# Patient Record
Sex: Male | Born: 1975 | Race: White | Hispanic: No | Marital: Married | Smoking: Never smoker
Health system: Southern US, Community
[De-identification: ages and names within clinical notes are randomized; demographics above are authoritative.]

## PROBLEM LIST (undated history)

## (undated) DIAGNOSIS — M419 Scoliosis, unspecified: Secondary | ICD-10-CM

## (undated) DIAGNOSIS — J45909 Unspecified asthma, uncomplicated: Secondary | ICD-10-CM

## (undated) DIAGNOSIS — L309 Dermatitis, unspecified: Secondary | ICD-10-CM

## (undated) HISTORY — DX: Dermatitis, unspecified: L30.9

## (undated) HISTORY — DX: Unspecified asthma, uncomplicated: J45.909

---

## 2017-01-28 ENCOUNTER — Encounter (HOSPITAL_COMMUNITY): Payer: Self-pay

## 2017-01-28 ENCOUNTER — Emergency Department (HOSPITAL_COMMUNITY)
Admission: EM | Admit: 2017-01-28 | Discharge: 2017-01-28 | Disposition: A | Discharge status: Home / Self Care | Payer: Managed Care, Other (non HMO) | Attending: Emergency Medicine | Admitting: Emergency Medicine

## 2017-01-28 ENCOUNTER — Emergency Department (HOSPITAL_COMMUNITY): Payer: Managed Care, Other (non HMO)

## 2017-01-28 DIAGNOSIS — K5900 Constipation, unspecified: Secondary | ICD-10-CM | POA: Insufficient documentation

## 2017-01-28 HISTORY — DX: Scoliosis, unspecified: M41.9

## 2017-01-28 LAB — URINALYSIS, ROUTINE W REFLEX MICROSCOPIC
BILIRUBIN URINE: NEGATIVE
Glucose, UA: NEGATIVE mg/dL
Hgb urine dipstick: NEGATIVE
KETONES UR: NEGATIVE mg/dL
Leukocytes, UA: NEGATIVE
NITRITE: NEGATIVE
PROTEIN: NEGATIVE mg/dL
Specific Gravity, Urine: 1.013 (ref 1.005–1.030)
pH: 8 (ref 5.0–8.0)

## 2017-01-28 LAB — COMPREHENSIVE METABOLIC PANEL
ALBUMIN: 4.6 g/dL (ref 3.5–5.0)
ALK PHOS: 73 U/L (ref 38–126)
ALT: 47 U/L (ref 17–63)
ANION GAP: 9 (ref 5–15)
AST: 45 U/L — ABNORMAL HIGH (ref 15–41)
BILIRUBIN TOTAL: 0.9 mg/dL (ref 0.3–1.2)
BUN: 13 mg/dL (ref 6–20)
CALCIUM: 9.5 mg/dL (ref 8.9–10.3)
CO2: 28 mmol/L (ref 22–32)
Chloride: 99 mmol/L — ABNORMAL LOW (ref 101–111)
Creatinine, Ser: 0.94 mg/dL (ref 0.61–1.24)
GFR calc Af Amer: >60 mL/min (ref >60)
GLUCOSE: 98 mg/dL (ref 65–99)
Potassium: 3.4 mmol/L — ABNORMAL LOW (ref 3.5–5.1)
Sodium: 136 mmol/L (ref 135–145)
TOTAL PROTEIN: 7.2 g/dL (ref 6.5–8.1)

## 2017-01-28 LAB — CBC
HCT: 42.7 % (ref 39.0–52.0)
HEMOGLOBIN: 15.2 g/dL (ref 13.0–17.0)
MCH: 30.2 pg (ref 26.0–34.0)
MCHC: 35.6 g/dL (ref 30.0–36.0)
MCV: 84.9 fL (ref 78.0–100.0)
Platelets: 248 10*3/uL (ref 150–400)
RBC: 5.03 MIL/uL (ref 4.22–5.81)
RDW: 12.5 % (ref 11.5–15.5)
WBC: 4.7 10*3/uL (ref 4.0–10.5)

## 2017-01-28 LAB — LIPASE, BLOOD: Lipase: 22 U/L (ref 11–51)

## 2017-01-28 MED ORDER — POLYETHYLENE GLYCOL 3350 17 G PO PACK: 17.0000 g | PACK | Freq: Every day | ORAL | 0 refills | Status: AC

## 2017-01-28 NOTE — ED Provider Notes (Signed)
[facility]-EMERGENCY DEPT Provider Note   CSN: [id number] Arrival date & time: 01/28/17  [id number]     History   Chief Complaint Chief Complaint  Patient presents with  . Abdominal Pain  . Constipation    HPI Travis Santana is a 41 y.o. male.  HPI   41 year old male, history of scoliosis and chronic back pain presenting complaining of constipation. Patient states he recently do more housework and may have aggravated his back. Because of that, he was seen by his PCP for management and was prescribed Flexeril, and tramadol. He took a few medications several days ago but for the past 4 days he has been having abdominal bloatedness, feeling constipated and having moderate abdominal pain. He normally has 2-3 bowel movements per day but hadn't had any since. He does not take more than recommended dose of pain medication. He denies having fever, lightheadedness, dizziness, nausea, vomiting, chest pain, shortness of breath, dysuria, hematuria. He denies any prior abdominal surgery and no history of SBO.  Past Medical History:  Diagnosis Date  . Scoliosis     There are no active problems to display for this patient.   History reviewed. No pertinent surgical history.     Home Medications    Prior to Admission medications   Not on File    Family History No family history on file.  Social History Social History  Substance Use Topics  . Smoking status: Never Smoker  . Smokeless tobacco: Never Used  . Alcohol use Not on file     Allergies   Patient has no allergy information on record.   Review of Systems Review of Systems  All other systems reviewed and are negative.    Physical Exam Updated Vital Signs BP (!) 134/91 (BP Location: Right Arm)   Pulse 72   Temp 97.9 F (36.6 C) (Oral)   Resp 14   Ht 5\' 6"  (1.676 m)   Wt 70.3 kg (155 lb)   SpO2 100%   BMI 25.02 kg/m   Physical Exam  Constitutional: He appears well-developed and well-nourished. No distress.    HENT:  Head: Atraumatic.  Eyes: Conjunctivae are normal.  Neck: Neck supple.  Cardiovascular: Normal rate, regular rhythm and intact distal pulses.   Pulmonary/Chest: Effort normal and breath sounds normal.  Abdominal: Soft. Bowel sounds are normal. He exhibits no distension. There is tenderness (Mild generalized abdominal tenderness without focal point tenderness. Negative Murphy sign, no pain at McBurney's point. No hernia noted).  Neurological: He is alert.  Skin: No rash noted.  Psychiatric: He has a normal mood and affect.  Nursing note and vitals reviewed.    ED Treatments / Results  Labs (all labs ordered are listed, but only abnormal results are displayed) Labs Reviewed  COMPREHENSIVE METABOLIC PANEL - Abnormal; Notable for the following:       Result Value   Potassium 3.4 (*)    Chloride 99 (*)    AST 45 (*)    All other components within normal limits  URINALYSIS, ROUTINE W REFLEX MICROSCOPIC - Abnormal; Notable for the following:    APPearance HAZY (*)    All other components within normal limits  LIPASE, BLOOD  CBC    EKG  EKG Interpretation None       Radiology Dg Abd Acute W/chest  Result Date: 01/28/2017 CLINICAL DATA:  Constipation and lower abdominal pain. EXAM: DG ABDOMEN ACUTE W/ 1V CHEST COMPARISON:  None. FINDINGS: Moderate amount of stool in the colon. There is  no evidence of dilated bowel loops or free intraperitoneal air. No radiopaque calculi or other significant radiographic abnormality is seen. Heart size and mediastinal contours are within normal limits. Both lungs are clear. Rotatory dextroscoliosis of the lumbar spine. IMPRESSION: Negative abdominal radiographs.  No acute cardiopulmonary disease. Electronically Signed   By: Obie Dredge M.D.   On: 01/28/2017 14:19    Procedures Procedures (including critical care time)  Medications Ordered in ED Medications - No data to display   Initial Impression / Assessment and Plan / ED Course   I have reviewed the triage vital signs and the nursing notes.  Pertinent labs & imaging results that were available during my care of the patient were reviewed by me and considered in my medical decision making (see chart for details).    BP (!) 140/105   Pulse 74   Temp 97.9 F (36.6 C) (Oral)   Resp 16   Ht 5\' 6"  (1.676 m)   Wt 70.3 kg (155 lb)   SpO2 100%   BMI 25.02 kg/m    Final Clinical Impressions(s) / ED Diagnoses   Final diagnoses:  Constipation, unspecified constipation type    New Prescriptions Discharge Medication List as of 01/28/2017  2:39 PM    START taking these medications   Details  polyethylene glycol (MIRALAX / GLYCOLAX) packet Take 17 g by mouth daily., Starting Thu 01/28/2017, Print       1:45 PM Patient had complained of constipation. Does admits to taking a few tramadol and muscle relaxant for his back several days prior. His abdomen is mildly tender but no peritoneal sign. Blood work unremarkable. Plan to obtain an acute abdominal series, will perform a digital rectal exam.  2:44 PM No evidence of stool impaction or mass on digital rectal exam. Normal rectal tone. Normal color stool on glove. UA shows no signs of urinary tract infection, labs are reassuring, an acute abdominal series obtained showing moderate stool burden but no evidence to suggest SBO. Patient felt better, he would like to be discharged. Patient recommended to return if worsen. Recommend avoid opiate use, recommend taking Miralax for his symptoms. Return precaution discussed.   Fayrene Helper, PA-C 01/28/17 1446    Long, Arlyss Repress, MD 01/28/17 [phone]

## 2017-01-28 NOTE — Discharge Instructions (Signed)
Your constipation may be due to recent intake of opiate medication. Take Miralax as prescribed.  Eat food high in fiber.  Return if your condition worsen, if you have vomiting or unable to pass gas.

## 2017-01-28 NOTE — ED Triage Notes (Signed)
Patient complains of chronic pain and has just had meds refilled for same. Developed constipation over the weekend and now complains of abdominal cramping, no emesis. NAD

## 2018-01-10 ENCOUNTER — Encounter: Payer: Self-pay | Admitting: Allergy

## 2018-01-10 ENCOUNTER — Ambulatory Visit (INDEPENDENT_AMBULATORY_CARE_PROVIDER_SITE_OTHER): Payer: Managed Care, Other (non HMO) | Admitting: Allergy

## 2018-01-10 VITALS — BP 146/98 | HR 83 | Temp 98.1°F | Resp 18 | Ht 64.0 in | Wt 173.4 lb

## 2018-01-10 DIAGNOSIS — J3089 Other allergic rhinitis: Secondary | ICD-10-CM

## 2018-01-10 DIAGNOSIS — J45990 Exercise induced bronchospasm: Secondary | ICD-10-CM | POA: Diagnosis not present

## 2018-01-10 DIAGNOSIS — L2089 Other atopic dermatitis: Secondary | ICD-10-CM | POA: Diagnosis not present

## 2018-01-10 MED ORDER — MONTELUKAST SODIUM 10 MG PO TABS
10.0000 mg | ORAL_TABLET | Freq: Every day | ORAL | 5 refills | Status: DC
Start: 2018-01-10 — End: 2018-06-24

## 2018-01-10 MED ORDER — AZELASTINE HCL 0.1 % NA SOLN: 2.0000 | Freq: Two times a day (BID) | NASAL | 5 refills | Status: AC

## 2018-01-10 MED ORDER — ALBUTEROL SULFATE HFA 108 (90 BASE) MCG/ACT IN AERS: 2.0000 | INHALATION_SPRAY | RESPIRATORY_TRACT | 5 refills | Status: AC | PRN

## 2018-01-10 NOTE — Progress Notes (Signed)
New Patient Note  RE: Travis Santana MRN: [id number] DOB: 04-23-1976 Date of Office Visit: 01/10/2018  Referring provider: No ref. provider found Primary care provider: System, Pcp Not In  Chief Complaint: dry cough  History of present illness: Travis Santana is a 42 y.o. male presenting today for evaluation of dry cough.    He has been having a dry cough for months that he feels is allergy related.  The cough does occur randomly throughout the day.  He reports nasal drainage with post-nasal drip and does feel a tickle in his throat with the cough.   He states the cough does seem to wake up at night.   He has been taking mucinex dm which has been helping with the cough.   He also states he uses a OTC throat spray that has a numbing effect that does help.  He states he knows the mucinex and the throat spray are not long-term solutions.   He has used flonase in the past but none recently with the increased drainage and cough.   He states he has tried some OTC allergy medications but does not use consistently.   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  He saw ENT (Dr. Constance Holster [facility]) who thought his cough is moreso related to reflux induced laryngitis and recommended avoidance of tobacco, caffeine, alcohol, chocolate and peppermint.    He does have a history of asthma diagnosed in childhood.   He states he has been wheezing only when he is exercising.  He does not have an albuterol inhaler at this time.     He does have history of eczema that was worse in the childhood.  He states he will rarely get a flare and states removal of gluten from the diet has helped.  He reports being lactose intolerance with abdominal pain and diarrhea following dairy ingestion.  He will take Lactaid pill prior to dairy ingestion which prevents abdominal symptoms.   Review of systems: Review of Systems  Constitutional: Negative for chills, fever and  malaise/fatigue.  HENT: Positive for congestion. Negative for ear discharge, ear pain, nosebleeds and sore throat.   Eyes: Negative for pain, discharge and redness.  Respiratory: Positive for cough and wheezing. Negative for sputum production and shortness of breath.   Cardiovascular: Negative for chest pain.  Gastrointestinal: Negative for abdominal pain, constipation, diarrhea, heartburn, nausea and vomiting.  Musculoskeletal: Negative for joint pain.  Skin: Negative for itching and rash.  Neurological: Negative for headaches.    All other systems negative unless noted above in HPI  Past medical history: Past Medical History:  Diagnosis Date  . Asthma   . Eczema   . Scoliosis     Past surgical history: History reviewed. No pertinent surgical history.  Family history:  Family History  Problem Relation Age of Onset  . Allergic rhinitis Mother     Social history: Lives in a home without carpeting with gas heating and central cooling.  3 dogs in the home.  No concern for water damage, mildew or roaches in the home.  He works as an TEFL teacher.  He denies smoking history.    Medication List: Allergies as of 01/10/2018      Reactions   Sulfa Antibiotics Other (See Comments)   Ulcers in mouth      Medication List        Accurate as of 01/10/18  4:04 PM. Always use  your most recent med list.          amLODipine 10 MG tablet Commonly known as:  NORVASC Take 10 mg by mouth every morning.   buPROPion 150 MG 24 hr tablet Commonly known as:  WELLBUTRIN XL Take 150 mg by mouth every morning.   finasteride 1 MG tablet Commonly known as:  PROPECIA Take 1 mg by mouth daily.   methocarbamol 500 MG tablet Commonly known as:  ROBAXIN Take 1,000 mg by mouth 4 (four) times daily as needed for muscle spasms.   omeprazole 20 MG capsule Commonly known as:  PRILOSEC Take 20 mg by mouth every morning.   polyethylene glycol packet Commonly known as:  MIRALAX /  GLYCOLAX Take 17 g by mouth daily.   rosuvastatin 40 MG tablet Commonly known as:  CRESTOR Take 40 mg by mouth at bedtime.   traMADol 50 MG tablet Commonly known as:  ULTRAM Take 50 mg by mouth every 6 (six) hours as needed for pain.   triamterene-hydrochlorothiazide 37.5-25 MG tablet Commonly known as:  MAXZIDE-25 Take 1 tablet by mouth every morning.   venlafaxine 75 MG tablet Commonly known as:  EFFEXOR Take 75 mg by mouth daily.       Known medication allergies: Allergies  Allergen Reactions  . Sulfa Antibiotics Other (See Comments)    Ulcers in mouth     Physical examination: Blood pressure (!) 146/98, pulse 83, temperature 98.1 F (36.7 C), temperature source Oral, resp. rate 18, height '5\' 4"'  (1.626 m), weight 173 lb 6.4 oz (78.7 kg), SpO2 99 %.2  General: Alert, interactive, in no acute distress. HEENT: PERRLA, TMs pearly gray, turbinates mildly edematous with clear discharge, post-pharynx non erythematous, mild cobblestoning Neck: Supple without lymphadenopathy. Lungs: Clear to auscultation without wheezing, rhonchi or rales. {no increased work of breathing. CV: Normal S1, S2 without murmurs. Abdomen: Nondistended, nontender. Skin: Warm and dry, without lesions or rashes. Extremities:  No clubbing, cyanosis or edema. Neuro:   Grossly intact.  Diagnositics/Labs: Spirometry: FEV1: 3.54L 100%, FVC: 4.57L 104%, ratio consistent with nonobstructive pattern  Allergy testing: environmental allergy skin prick testing is positive to weeds, trees, molds, dust mites, cat Intradermal testing is positive to [place], grass mix, dog Wheat skin prick test is negative.  Allergy testing results were read and interpreted by provider, documented by clinical staff.   Assessment and plan:   Allergic rhinitis with significant post-nasal drip leading to cough   - environmental allergy skin testing today is positive to grass, weeds, trees, mold, dust mites, cat, dog   -  allergen avoidance measures discussed/handouts provided   - for nasal drainage/post-nasal drip recommend use of nasal antihistamine, Astelin 2 sprays each nostril 1-2 times a day   - for nasal congestion recommend use of nasal steroid spray like OTC Flonase, Rhinocort or Nasacort 2 sprays each nostril daily.  Use for 1-2 weeks at a time before stopping once symptoms improve   - resume singulair 72m daily - take at bedtime   - recommend a daily antihistamine like Allegra 1857m Xyzal 56m71mr Zyrtec 57m93mily as needed for general allergy symptom control   - nasal saline rinse daily prior to medicated nasal sprays.  Kit provided today   - allergen immunotherapy discussed today including protocol, benefits and risk.  Informational handout provided.  If interested in this therapuetic option you can check with your insurance carrier for coverage.  Let us [facility] if you would like to proceed with this option.  Exercise-induced bronchospasm   - have access to albuterol inhaler 2 puffs every 4-6 hours as needed for cough/wheeze/shortness of breath/chest tightness.  May use 15-20 minutes prior to activity.   Monitor frequency of use.    Atopic dermatitis   - rare flares   - continue daily moisturization   - removal of gluten from diet have been beneficial at reducing flares.  Wheat skin prick testing is negative.   Follow-up 4-6 months or sooner if needed  I appreciate the opportunity to take part in Hodari's care. Please do not hesitate to contact me with questions.  Sincerely,   Prudy Feeler, MD Allergy/Immunology [facility]

## 2018-01-10 NOTE — Patient Instructions (Addendum)
Allergies with significant post-nasal drip leading to cough   - environmental allergy skin testing today is positive to grass, weeds, trees, mold, dust mites, cat, dog   - allergen avoidance measures discussed/handouts provided   - for nasal drainage/post-nasal drip recommend use of nasal antihistamine, Astelin 2 sprays each nostril 1-2 times a day   - for nasal congestion recommend use of nasal steroid spray like OTC Flonase, Rhinocort or Nasacort 2 sprays each nostril daily.  Use for 1-2 weeks at a time before stopping once symptoms improve   - resume singulair 677m daily - take at bedtime   - recommend a daily antihistamine like Allegra 1863m Xyzal 77m27mr Zyrtec 6m32mily as needed for general allergy symptom control   - nasal saline rinse daily prior to medicated nasal sprays.  Kit provided today   - allergen immunotherapy discussed today including protocol, benefits and risk.  Informational handout provided.  If interested in this therapuetic option you can check with your insurance carrier for coverage.  Let us [place] if you would like to proceed with this option.    Exercise induced bronchospasm   - have access to albuterol inhaler 2 puffs every 4-6 hours as needed for cough/wheeze/shortness of breath/chest tightness.  May use 15-20 minutes prior to activity.   Monitor frequency of use.    Follow-up 4-6 months or sooner if needed

## 2018-06-24 ENCOUNTER — Other Ambulatory Visit: Payer: Self-pay | Admitting: Allergy

## 2018-06-27 ENCOUNTER — Ambulatory Visit: Payer: Self-pay | Admitting: Allergy

## 2018-07-08 ENCOUNTER — Ambulatory Visit: Payer: Self-pay | Admitting: Allergy

## 2018-07-20 ENCOUNTER — Ambulatory Visit: Payer: Self-pay | Admitting: Allergy

## 2018-08-10 ENCOUNTER — Ambulatory Visit: Payer: Self-pay | Admitting: Allergy

## 2018-08-21 ENCOUNTER — Other Ambulatory Visit: Payer: Self-pay | Admitting: Allergy

## 2018-08-22 NOTE — Telephone Encounter (Signed)
Pt last seen 12/2017- pt was to return 6 months later. Courtesy refill already given 06/2018. Denied Montelukast- pt needs ov.

## 2018-08-28 IMAGING — CR DG ABDOMEN ACUTE W/ 1V CHEST
3 series · 3 of 3 positions shown · non-contrast
Comparison: None.

CLINICAL DATA: Constipation and lower abdominal pain.

EXAM:
DG ABDOMEN ACUTE W/ 1V CHEST

[chest pa]
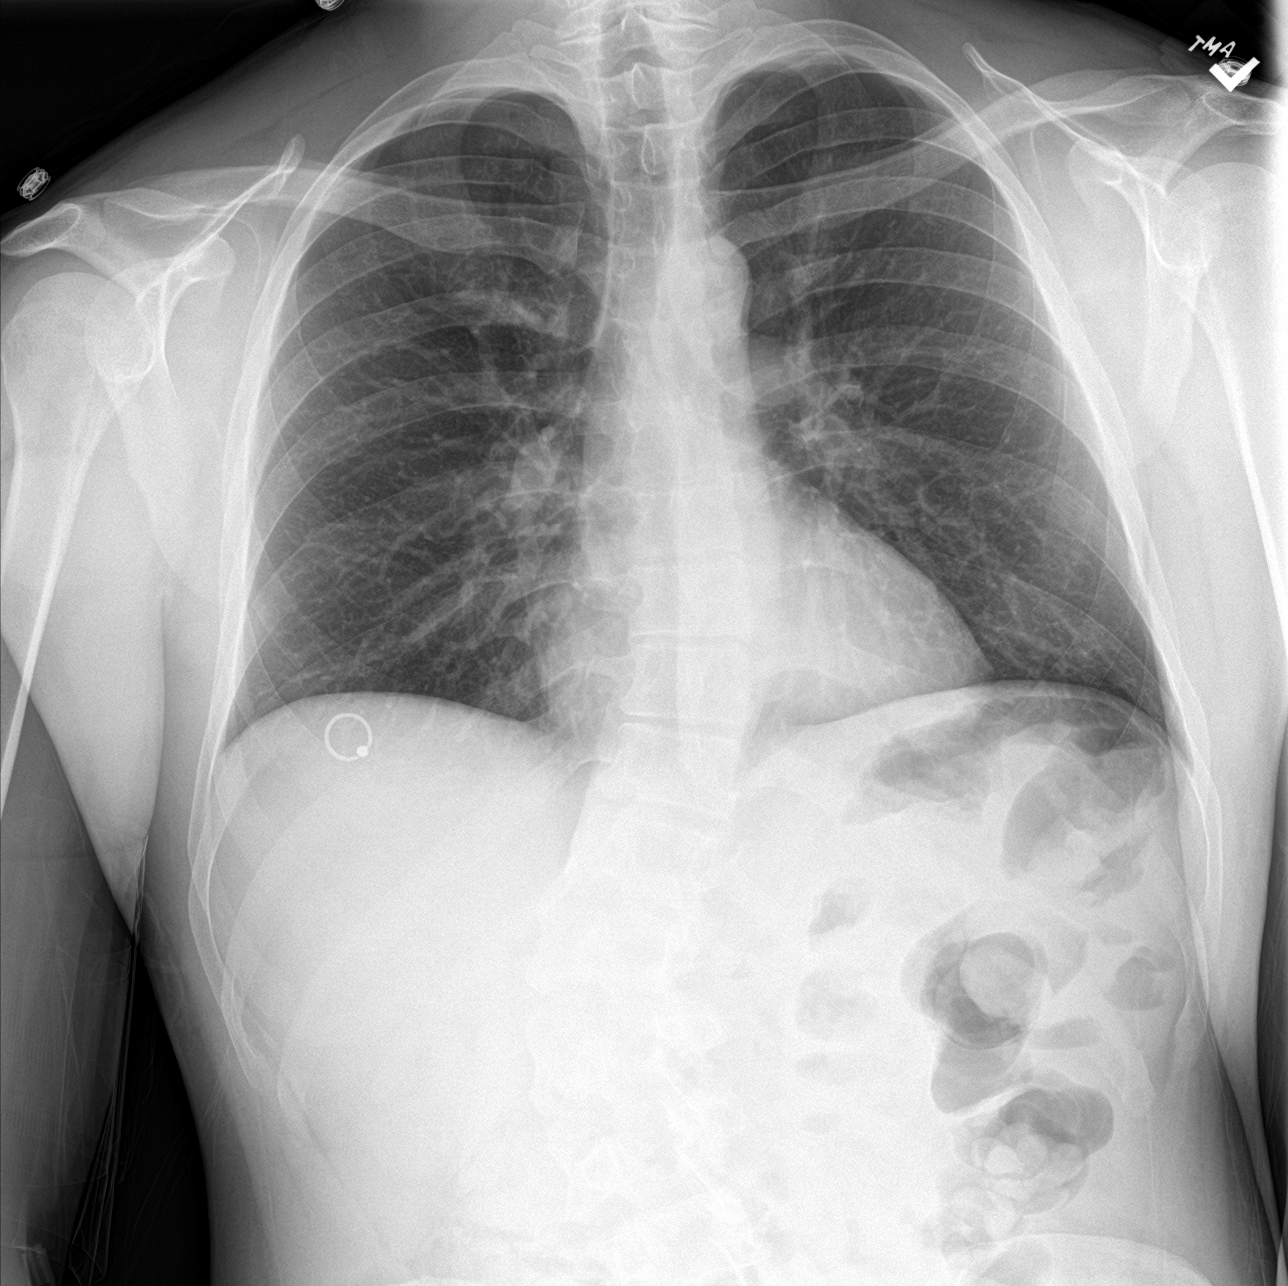

[abdomen erect]
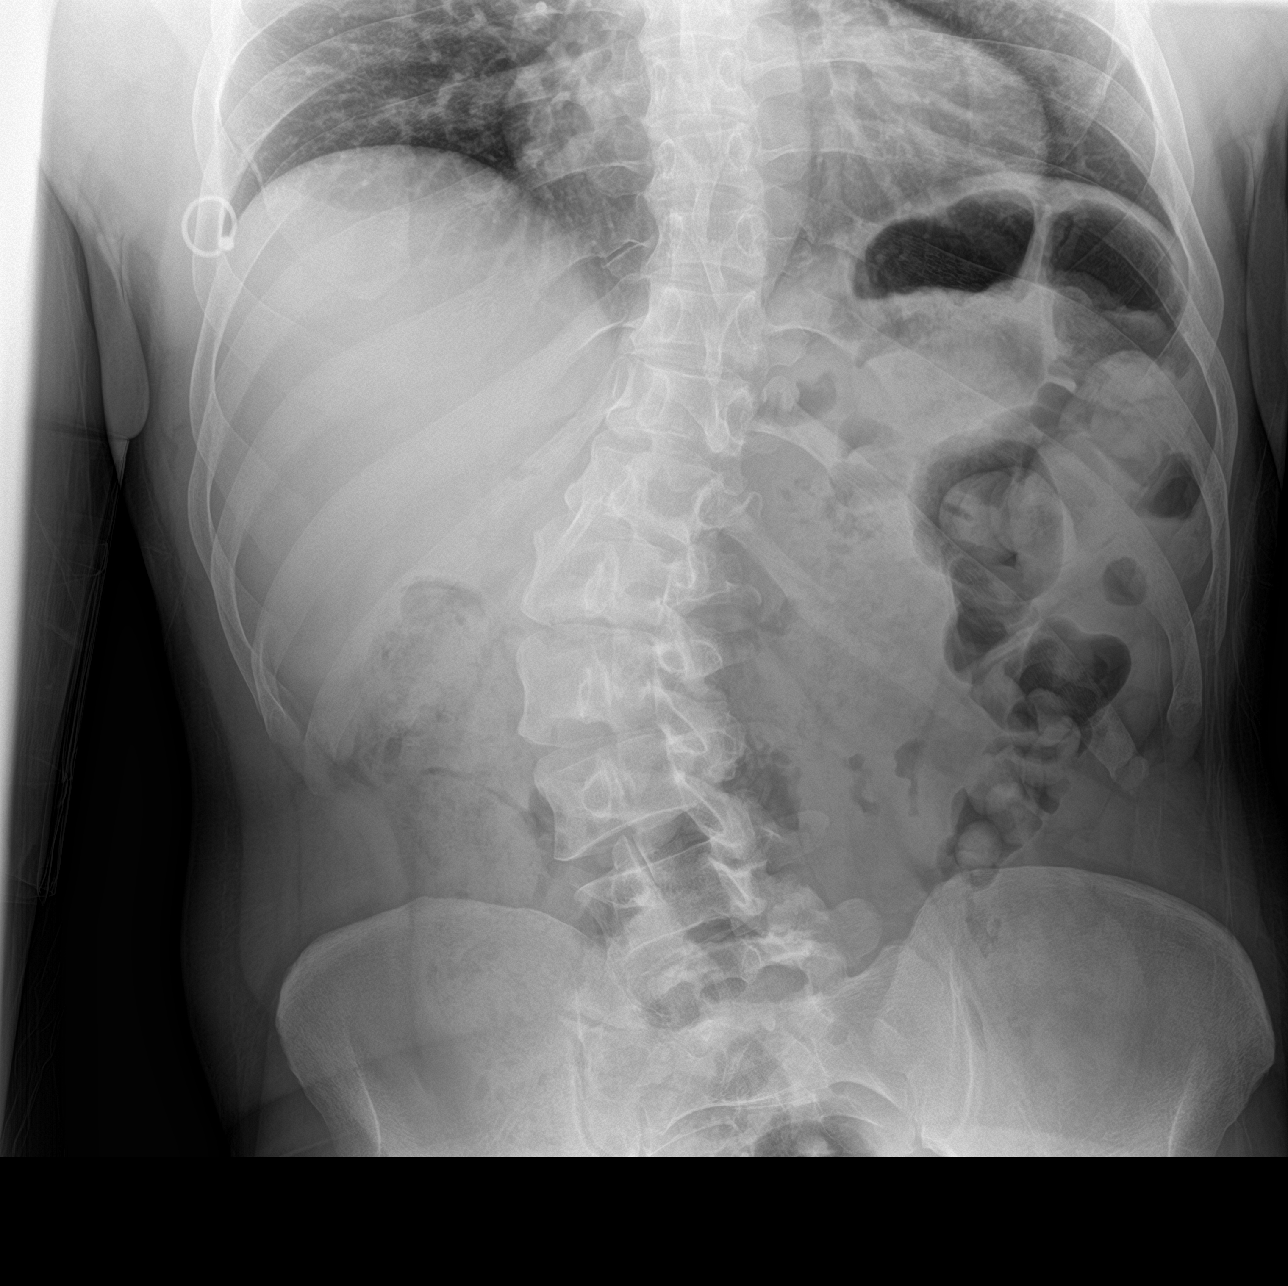

[abdomen supine]
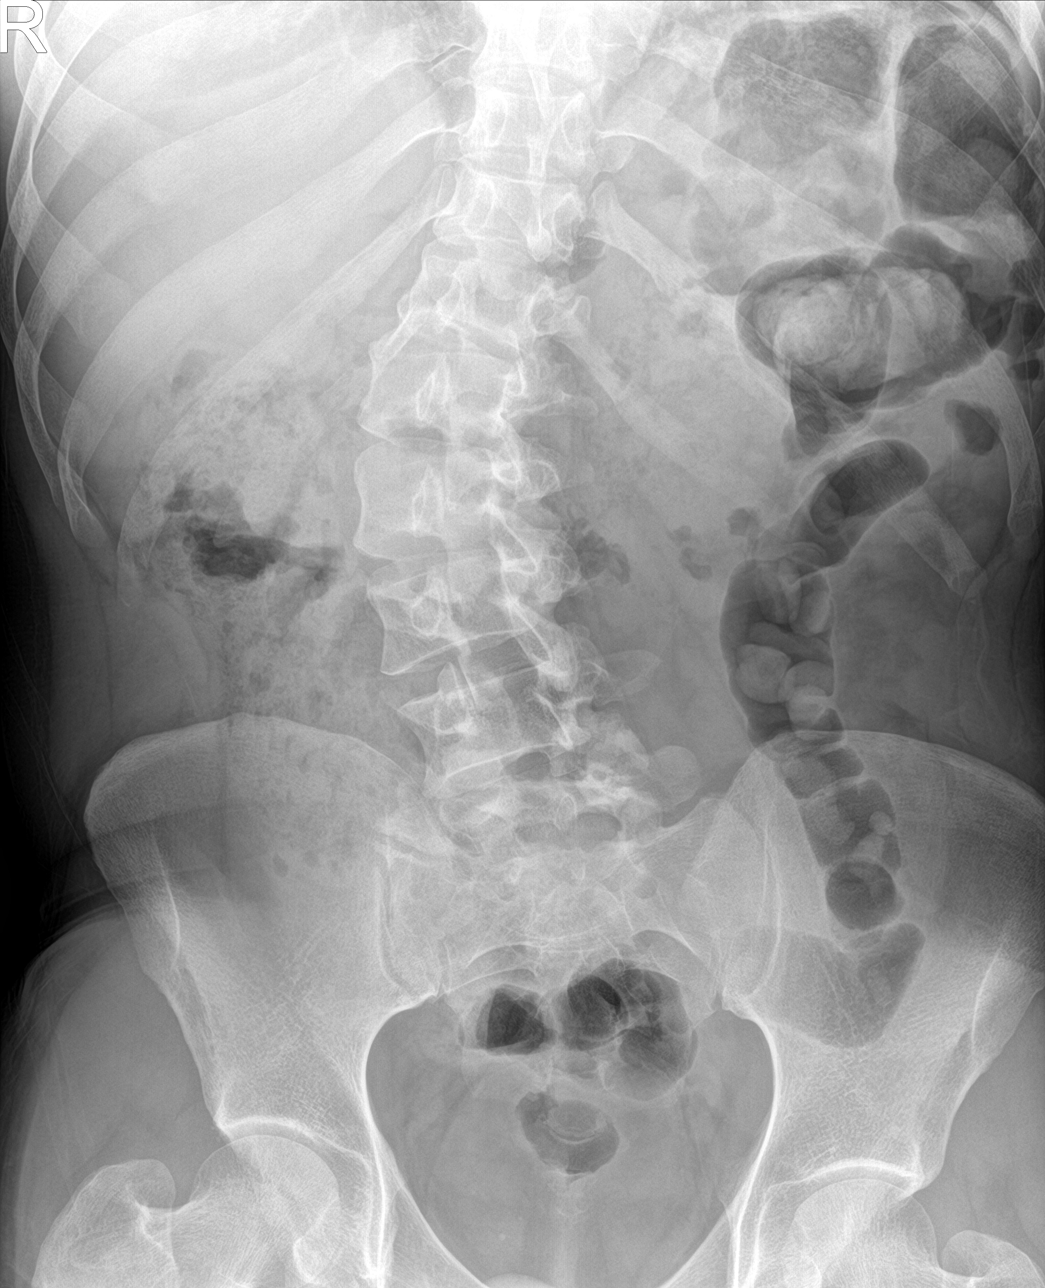

[3 of 3 positions shown; findings below may reference images not displayed]

FINDINGS: Moderate amount of stool in the colon. There is no evidence of
dilated bowel loops or free intraperitoneal air. No radiopaque
calculi or other significant radiographic abnormality is seen. Heart
size and mediastinal contours are within normal limits. Both lungs
are clear. Rotatory dextroscoliosis of the lumbar spine.
IMPRESSION: Negative abdominal radiographs.  No acute cardiopulmonary disease.
